# Patient Record
Sex: Male | Born: 1960 | Race: Black or African American | Hispanic: No | Marital: Married | Smoking: Never smoker
Health system: Southern US, Community
[De-identification: ages and names within clinical notes are randomized; demographics above are authoritative.]

## PROBLEM LIST (undated history)

## (undated) DIAGNOSIS — R569 Unspecified convulsions: Secondary | ICD-10-CM

## (undated) DIAGNOSIS — T7840XA Allergy, unspecified, initial encounter: Secondary | ICD-10-CM

## (undated) HISTORY — DX: Unspecified convulsions: R56.9

## (undated) HISTORY — DX: Allergy, unspecified, initial encounter: T78.40XA

---

## 2003-04-23 ENCOUNTER — Emergency Department (HOSPITAL_COMMUNITY): Admission: EM | Admit: 2003-04-23 | Discharge: 2003-04-23 | Payer: Self-pay | Admitting: Emergency Medicine

## 2007-07-11 ENCOUNTER — Emergency Department (HOSPITAL_COMMUNITY): Admission: EM | Admit: 2007-07-11 | Discharge: 2007-07-11 | Payer: Self-pay | Admitting: Emergency Medicine

## 2007-07-15 ENCOUNTER — Emergency Department (HOSPITAL_COMMUNITY): Admission: EM | Admit: 2007-07-15 | Discharge: 2007-07-15 | Payer: Self-pay | Admitting: Emergency Medicine

## 2007-08-18 ENCOUNTER — Emergency Department (HOSPITAL_COMMUNITY): Admission: EM | Admit: 2007-08-18 | Discharge: 2007-08-18 | Payer: Self-pay | Admitting: Emergency Medicine

## 2009-11-26 ENCOUNTER — Emergency Department (HOSPITAL_COMMUNITY)
Admission: EM | Admit: 2009-11-26 | Discharge: 2009-11-26 | Payer: Self-pay | Source: Home / Self Care | Admitting: Emergency Medicine

## 2012-06-08 ENCOUNTER — Encounter: Payer: Self-pay | Admitting: Internal Medicine

## 2012-06-08 ENCOUNTER — Ambulatory Visit (INDEPENDENT_AMBULATORY_CARE_PROVIDER_SITE_OTHER): Payer: Self-pay | Admitting: Internal Medicine

## 2012-06-08 ENCOUNTER — Other Ambulatory Visit (INDEPENDENT_AMBULATORY_CARE_PROVIDER_SITE_OTHER): Payer: Self-pay

## 2012-06-08 VITALS — BP 142/80 | HR 60 | Temp 98.8°F | Resp 16 | Ht 73.5 in | Wt 207.5 lb

## 2012-06-08 DIAGNOSIS — Z0000 Encounter for general adult medical examination without abnormal findings: Secondary | ICD-10-CM

## 2012-06-08 DIAGNOSIS — Z23 Encounter for immunization: Secondary | ICD-10-CM

## 2012-06-08 DIAGNOSIS — K4090 Unilateral inguinal hernia, without obstruction or gangrene, not specified as recurrent: Secondary | ICD-10-CM

## 2012-06-08 LAB — LIPID PANEL
HDL: 44.8 mg/dL (ref >39.00)
Total CHOL/HDL Ratio: 4
Triglycerides: 60 mg/dL (ref 0.0–149.0)
VLDL: 12 mg/dL (ref 0.0–40.0)

## 2012-06-08 LAB — URINALYSIS, ROUTINE W REFLEX MICROSCOPIC
Bilirubin Urine: NEGATIVE
Hgb urine dipstick: NEGATIVE
Ketones, ur: NEGATIVE
Nitrite: NEGATIVE
Total Protein, Urine: NEGATIVE
Urine Glucose: NEGATIVE
pH: 6 (ref 5.0–8.0)

## 2012-06-08 LAB — CBC WITH DIFFERENTIAL/PLATELET
Basophils Relative: 0.6 % (ref 0.0–3.0)
HCT: 40.1 % (ref 39.0–52.0)
Hemoglobin: 13.9 g/dL (ref 13.0–17.0)
Lymphocytes Relative: 31.3 % (ref 12.0–46.0)
Lymphs Abs: 1.6 10*3/uL (ref 0.7–4.0)
MCHC: 34.5 g/dL (ref 30.0–36.0)
Monocytes Relative: 15.6 % — ABNORMAL HIGH (ref 3.0–12.0)
Neutro Abs: 2.6 10*3/uL (ref 1.4–7.7)
RBC: 4.59 Mil/uL (ref 4.22–5.81)

## 2012-06-08 LAB — COMPREHENSIVE METABOLIC PANEL
AST: 35 U/L (ref 0–37)
BUN: 13 mg/dL (ref 6–23)
Calcium: 9.4 mg/dL (ref 8.4–10.5)
Chloride: 104 mEq/L (ref 96–112)
Creatinine, Ser: 1 mg/dL (ref 0.4–1.5)
GFR: 97.34 mL/min (ref >60.00)
Total Bilirubin: 1 mg/dL (ref 0.3–1.2)

## 2012-06-08 LAB — PSA: PSA: 6.42 ng/mL — ABNORMAL HIGH (ref 0.10–4.00)

## 2012-06-08 NOTE — Assessment & Plan Note (Addendum)
Exam done Vaccines were updated Labs ordered He was referred for a screening colonoscopy Pt ed material was given

## 2012-06-08 NOTE — Assessment & Plan Note (Signed)
General surgery referral 

## 2012-06-08 NOTE — Patient Instructions (Signed)
Health Maintenance, Males A healthy lifestyle and preventative care can promote health and wellness.  Maintain regular health, dental, and eye exams.  Eat a healthy diet. Foods like vegetables, fruits, whole grains, low-fat dairy products, and lean protein foods contain the nutrients you need without too many calories. Decrease your intake of foods high in solid fats, added sugars, and salt. Get information about a proper diet from your caregiver, if necessary.  Regular physical exercise is one of the most important things you can do for your health. Most adults should get at least 150 minutes of moderate-intensity exercise (any activity that increases your heart rate and causes you to sweat) each week. In addition, most adults need muscle-strengthening exercises on 2 or more days a week.   Maintain a healthy weight. The body mass index (BMI) is a screening tool to identify possible weight problems. It provides an estimate of body fat based on height and weight. Your caregiver can help determine your BMI, and can help you achieve or maintain a healthy weight. For adults 20 years and older:  A BMI below 18.5 is considered underweight.  A BMI of 18.5 to 24.9 is normal.  A BMI of 25 to 29.9 is considered overweight.  A BMI of 30 and above is considered obese.  Maintain normal blood lipids and cholesterol by exercising and minimizing your intake of saturated fat. Eat a balanced diet with plenty of fruits and vegetables. Blood tests for lipids and cholesterol should begin at age 20 and be repeated every 5 years. If your lipid or cholesterol levels are high, you are over 50, or you are a high risk for heart disease, you may need your cholesterol levels checked more frequently.Ongoing high lipid and cholesterol levels should be treated with medicines, if diet and exercise are not effective.  If you smoke, find out from your caregiver how to quit. If you do not use tobacco, do not start.  If you  choose to drink alcohol, do not exceed 2 drinks per day. One drink is considered to be 12 ounces (355 mL) of beer, 5 ounces (148 mL) of wine, or 1.5 ounces (44 mL) of liquor.  Avoid use of street drugs. Do not share needles with anyone. Ask for help if you need support or instructions about stopping the use of drugs.  High blood pressure causes heart disease and increases the risk of stroke. Blood pressure should be checked at least every 1 to 2 years. Ongoing high blood pressure should be treated with medicines if weight loss and exercise are not effective.  If you are 45 to 53 years old, ask your caregiver if you should take aspirin to prevent heart disease.  Diabetes screening involves taking a blood sample to check your fasting blood sugar level. This should be done once every 3 years, after age 45, if you are within normal weight and without risk factors for diabetes. Testing should be considered at a younger age or be carried out more frequently if you are overweight and have at least 1 risk factor for diabetes.  Colorectal cancer can be detected and often prevented. Most routine colorectal cancer screening begins at the age of 50 and continues through age 75. However, your caregiver may recommend screening at an earlier age if you have risk factors for colon cancer. On a yearly basis, your caregiver may provide home test kits to check for hidden blood in the stool. Use of a small camera at the end of a tube,   to directly examine the colon (sigmoidoscopy or colonoscopy), can detect the earliest forms of colorectal cancer. Talk to your caregiver about this at age 50, when routine screening begins. Direct examination of the colon should be repeated every 5 to 10 years through age 75, unless early forms of pre-cancerous polyps or small growths are found.  Hepatitis C blood testing is recommended for all people born from 1945 through 1965 and any individual with known risks for hepatitis C.  Healthy  men should no longer receive prostate-specific antigen (PSA) blood tests as part of routine cancer screening. Consult with your caregiver about prostate cancer screening.  Testicular cancer screening is not recommended for adolescents or adult males who have no symptoms. Screening includes self-exam, caregiver exam, and other screening tests. Consult with your caregiver about any symptoms you have or any concerns you have about testicular cancer.  Practice safe sex. Use condoms and avoid high-risk sexual practices to reduce the spread of sexually transmitted infections (STIs).  Use sunscreen with a sun protection factor (SPF) of 30 or greater. Apply sunscreen liberally and repeatedly throughout the day. You should seek shade when your shadow is shorter than you. Protect yourself by wearing long sleeves, pants, a wide-brimmed hat, and sunglasses year round, whenever you are outdoors.  Notify your caregiver of new moles or changes in moles, especially if there is a change in shape or color. Also notify your caregiver if a mole is larger than the size of a pencil eraser.  A one-time screening for abdominal aortic aneurysm (AAA) and surgical repair of large AAAs by sound wave imaging (ultrasonography) is recommended for ages 65 to 75 years who are current or former smokers.  Stay current with your immunizations. Document Released: 07/18/2007 Document Revised: 04/13/2011 Document Reviewed: 06/16/2010 [employer] Patient Information 2013 [employer].  

## 2012-06-08 NOTE — Progress Notes (Signed)
  Subjective:    Patient ID: Greg Kim, male    DOB: 02/10/1960, 52 y.o.   MRN: [id number]  HPI  New to me for a physical - he complains of a bulge in his left groin for one year, there is no associated pain.  Review of Systems  Constitutional: Negative.   HENT: Negative.   Eyes: Negative.   Respiratory: Negative.  Negative for cough, chest tightness, shortness of breath, wheezing and stridor.   Cardiovascular: Negative.  Negative for chest pain, palpitations and leg swelling.  Gastrointestinal: Negative.  Negative for nausea, vomiting, abdominal pain, diarrhea, constipation, abdominal distention, anal bleeding and rectal pain.  Endocrine: Negative.   Genitourinary: Negative.  Negative for dysuria, urgency, frequency, hematuria, flank pain, decreased urine volume, discharge, penile swelling, scrotal swelling, enuresis, difficulty urinating, genital sores, penile pain and testicular pain.  Musculoskeletal: Negative.   Skin: Negative.   Allergic/Immunologic: Negative.   Neurological: Negative.   Hematological: Negative.   Psychiatric/Behavioral: Negative.        Objective:   Physical Exam  Vitals reviewed. Constitutional: He is oriented to person, place, and time. He appears well-developed and well-nourished. No distress.  HENT:  Head: Normocephalic and atraumatic.  Mouth/Throat: Oropharynx is clear and moist. No oropharyngeal exudate.  Eyes: Conjunctivae are normal. Right eye exhibits no discharge. Left eye exhibits no discharge. No scleral icterus.  Neck: Normal range of motion. Neck supple. No JVD present. No tracheal deviation present. No thyromegaly present.  Cardiovascular: Normal rate, regular rhythm, normal heart sounds and intact distal pulses.  Exam reveals no gallop and no friction rub.   No murmur heard. Pulmonary/Chest: Effort normal and breath sounds normal. No stridor. No respiratory distress. He has no wheezes. He has no rales. He exhibits no tenderness.   Abdominal: Soft. Bowel sounds are normal. He exhibits no distension and no mass. There is no tenderness. There is no rebound and no guarding. A hernia is present. Hernia confirmed positive in the left inguinal area. Hernia confirmed negative in the right inguinal area.  Genitourinary: Rectum normal and penis normal. Rectal exam shows no external hemorrhoid, no internal hemorrhoid, no fissure, no mass, no tenderness and anal tone normal. Guaiac negative stool. Prostate is enlarged (1+ smooth symm BPH). Prostate is not tender. Right testis shows no mass, no swelling and no tenderness. Right testis is descended. Left testis shows swelling. Left testis shows no mass and no tenderness. Left testis is descended. Uncircumcised. No phimosis, paraphimosis, hypospadias, penile erythema or penile tenderness. No discharge found.  Right hemiscrotum feels empty, the left side is filled with hernia, there is no mass,swelling,tenderness  Musculoskeletal: Normal range of motion. He exhibits no edema and no tenderness.  Lymphadenopathy:    He has no cervical adenopathy.       Right: No inguinal adenopathy present.       Left: No inguinal adenopathy present.  Neurological: He is oriented to person, place, and time.  Skin: Skin is warm and dry. No rash noted. He is not diaphoretic. No erythema. No pallor.  Psychiatric: He has a normal mood and affect. His behavior is normal. Judgment and thought content normal.      No results found for this basename: WBC, HGB, HCT, PLT, GLUCOSE, CHOL, TRIG, HDL, LDLDIRECT, LDLCALC, ALT, AST, NA, K, CL, CREATININE, BUN, CO2, TSH, PSA, INR, GLUF, HGBA1C, MICROALBUR      Assessment & Plan:

## 2012-06-10 ENCOUNTER — Ambulatory Visit (INDEPENDENT_AMBULATORY_CARE_PROVIDER_SITE_OTHER): Payer: Self-pay | Admitting: General Surgery

## 2012-07-01 ENCOUNTER — Ambulatory Visit (INDEPENDENT_AMBULATORY_CARE_PROVIDER_SITE_OTHER): Payer: Self-pay | Admitting: General Surgery

## 2017-09-02 ENCOUNTER — Emergency Department (HOSPITAL_COMMUNITY): Payer: No Typology Code available for payment source

## 2017-09-02 ENCOUNTER — Emergency Department (HOSPITAL_COMMUNITY)
Admission: EM | Admit: 2017-09-02 | Discharge: 2017-09-02 | Disposition: A | Discharge status: Home / Self Care | Payer: No Typology Code available for payment source | Attending: Emergency Medicine | Admitting: Emergency Medicine

## 2017-09-02 ENCOUNTER — Encounter (HOSPITAL_COMMUNITY): Payer: Self-pay | Admitting: Emergency Medicine

## 2017-09-02 ENCOUNTER — Other Ambulatory Visit: Payer: Self-pay

## 2017-09-02 DIAGNOSIS — M791 Myalgia, unspecified site: Secondary | ICD-10-CM | POA: Diagnosis not present

## 2017-09-02 DIAGNOSIS — K4090 Unilateral inguinal hernia, without obstruction or gangrene, not specified as recurrent: Secondary | ICD-10-CM

## 2017-09-02 DIAGNOSIS — M7918 Myalgia, other site: Secondary | ICD-10-CM

## 2017-09-02 MED ORDER — CYCLOBENZAPRINE HCL 10 MG PO TABS: 10.0000 mg | ORAL_TABLET | Freq: Two times a day (BID) | ORAL | 0 refills | Status: DC | PRN
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
## 2017-09-02 NOTE — Discharge Instructions (Addendum)
Please read attached information. If you experience any new or worsening signs or symptoms please return to the emergency room for evaluation. Please follow-up with your primary care provider or specialist as discussed. Please use medication prescribed only as directed and discontinue taking if you have any concerning signs or symptoms.   °
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
## 2020-06-12 ENCOUNTER — Encounter: Payer: Self-pay | Admitting: *Deleted

## 2020-06-14 ENCOUNTER — Encounter: Payer: Self-pay | Admitting: Neurology

## 2020-06-14 ENCOUNTER — Ambulatory Visit: Payer: Self-pay | Admitting: Neurology

## 2020-06-14 ENCOUNTER — Other Ambulatory Visit: Payer: Self-pay

## 2020-06-14 VITALS — BP 135/70 | HR 46 | Ht 73.0 in | Wt 178.5 lb

## 2020-06-14 DIAGNOSIS — G40909 Epilepsy, unspecified, not intractable, without status epilepticus: Secondary | ICD-10-CM

## 2020-06-14 MED ORDER — LEVETIRACETAM ER 500 MG PO TB24: 1000.0000 mg | ORAL_TABLET | Freq: Every day | ORAL | 4 refills | Status: AC

## 2020-06-14 NOTE — Progress Notes (Signed)
Chief Complaint  Patient presents with  . New Patient (Initial Visit)    RM 12, alone. Paper referral from Silvio Clayman at [facility] for seizure disorder. First seizure 04/08/2020. He was working 3-4 jobs in 1 week that week and then drove his wife out of state for her mother b-day. Feels he was under a lot of stress and this was cause of seizures. No episodes since this. Feeling ok. No new sx. Does smoke Marijuana weekly. Only taking Keppra weekly, feels fine and did not feel he needed to take as prescribed.  . PCP    Silvio Clayman      ASSESSMENT AND PLAN  Greg Kim is a 60 y.o. male   Nocturnal seizure on April 09, 2020 Reported recurrent spells of short episode of strange smells, last episode was 3 weeks ago end of April 2022  Complete seizure evaluation with MRI of the brain with without contrast  EEG  Laboratory evaluations  Receives recurrent spells of strange smells, suggestive of partial seizures, emphasized importance of compliance with his antiepileptic medications, after discussed patient, proceed with Keppra xr 500 mg 2 tablets every night  No driving until seizure-free for 6 months   DIAGNOSTIC DATA (LABS, IMAGING, TESTING) - I reviewed patient records, labs, notes, testing and imaging myself where available.   HISTORICAL  Greg Kim is a 60 year old male seen in request by his primary care physician Dr. Salli Real, for evaluation of seizure, initial evaluation was a long on Jun 14, 2020  I reviewed and summarized the referring note.  He was previously healthy, very active, work as a Event organiser in Holiday representative work, including roofing,  On March 8 early morning, he was visiting with his mother-in-law in [place], after few hours into sleep, he was noted by his wife had a seizure, ambulance was called, he was evaluated at local hospital, few hours later he was discharged with Keppra 500 mg twice daily  Patient only scattered memory of the  event, he cannot recall his ER visit, only when he was ready to be discharged, he remembered the conversation, was told he had a seizure, he did take Keppra 500 mg twice a day for 1 month, complains of fatigue, sleepiness, after he ran out of the medication, he stopped taking it  Following his nocturnal seizure, he did have recurrent spells of sudden episode of smelling something strange, lasting for couple minutes, no loss of consciousness, most recent one was 3 weeks ago in April 2022    PHYSICAL EXAM:   Vitals:   06/14/20 0910  BP: 135/70  Pulse: (!) 46  SpO2: 98%  Weight: 178 lb 8 oz (81 kg)  Height: 6\' 1"  (1.854 m)   Not recorded     Body mass index is 23.55 kg/m.  PHYSICAL EXAMNIATION:  Gen: NAD, conversant, well nourised, well groomed                     Cardiovascular: Regular rate rhythm, no peripheral edema, warm, nontender. Eyes: Conjunctivae clear without exudates or hemorrhage Neck: Supple, no carotid bruits. Pulmonary: Clear to auscultation bilaterally   NEUROLOGICAL EXAM:  MENTAL STATUS: Speech:    Speech is normal; fluent and spontaneous with normal comprehension.  Cognition:     Orientation to time, place and person     Normal recent and remote memory     Normal Attention span and concentration     Normal Language, naming, repeating,spontaneous speech  Fund of knowledge   CRANIAL NERVES: CN II: Visual fields are full to confrontation. Pupils are round equal and briskly reactive to light. CN III, IV, VI: extraocular movement are normal. No ptosis. CN V: Facial sensation is intact to light touch CN VII: Face is symmetric with normal eye closure  CN VIII: Hearing is normal to causal conversation. CN IX, X: Phonation is normal. CN XI: Head turning and shoulder shrug are intact  MOTOR: There is no pronator drift of out-stretched arms. Muscle bulk and tone are normal. Muscle strength is normal.  REFLEXES: Reflexes are 2+ and symmetric at the  biceps, triceps, knees, and ankles. Plantar responses are flexor.  SENSORY: Intact to light touch, pinprick and vibratory sensation are intact in fingers and toes.  COORDINATION: There is no trunk or limb dysmetria noted.  GAIT/STANCE: Posture is normal. Gait is steady with normal steps, base, arm swing, and turning. Heel and toe walking are normal. Tandem gait is normal.  Romberg is absent.  REVIEW OF SYSTEMS:  Full 14 system review of systems performed and notable only for as above All other review of systems were negative.   ALLERGIES: No Known Allergies  HOME MEDICATIONS: Current Outpatient Medications  Medication Sig Dispense Refill  . levETIRAcetam (KEPPRA) 500 MG tablet Take 500 mg by mouth 2 (two) times daily. (Patient not taking: No sig reported)     No current facility-administered medications for this visit.    PAST MEDICAL HISTORY: Past Medical History:  Diagnosis Date  . Allergy   . Seizure (HCC)     PAST SURGICAL HISTORY: Past Surgical History:  Procedure Laterality Date  . No past surgery      FAMILY HISTORY: Family History  Problem Relation Age of Onset  . Arthritis Mother   . Heart disease Father   . Hypertension Father   . Arthritis Other   . Hypertension Other   . Cancer Neg Hx   . COPD Neg Hx   . Diabetes Neg Hx   . Early death Neg Hx   . Hearing loss Neg Hx   . Hyperlipidemia Neg Hx   . Kidney disease Neg Hx   . Stroke Neg Hx     SOCIAL HISTORY: Social History   Socioeconomic History  . Marital status: Married    Spouse name: Branndon Tuite  . Number of children: 1  . Years of education: 59  . Highest education level: Not on file  Occupational History  . Occupation: Self employed  Tobacco Use  . Smoking status: Never Smoker  . Smokeless tobacco: Never Used  Substance and Sexual Activity  . Alcohol use: No  . Drug use: Yes    Types: Marijuana    Comment: 1-2 times per week  . Sexual activity: Yes  Other Topics Concern   . Not on file  Social History Narrative   Lives   Caffeine use:   Social Determinants of Health   Financial Resource Strain: Not on file  Food Insecurity: Not on file  Transportation Needs: Not on file  Physical Activity: Not on file  Stress: Not on file  Social Connections: Not on file  Intimate Partner Violence: Not on file      Levert Feinstein, M.D. Ph.D.  [facility] [address] [place], [place] [address] Ph: [phone] Fax: [phone]  CC:  Salli Real, MD [address] [place],  [place] [address]  Salli Real, MD

## 2020-06-14 NOTE — Patient Instructions (Signed)
No driving until seizure-free for 6 months 

## 2020-06-15 LAB — COMPREHENSIVE METABOLIC PANEL
Alkaline Phosphatase: 70 IU/L (ref 44–121)
Creatinine, Ser: 0.92 mg/dL (ref 0.76–1.27)

## 2020-06-15 LAB — CBC WITH DIFFERENTIAL
Basos: 1 %
EOS (ABSOLUTE): 0.1 10*3/uL (ref 0.0–0.4)
Immature Granulocytes: 0 %
MCV: 89 fL (ref 79–97)
RBC: 4.31 x10E6/uL (ref 4.14–5.80)

## 2020-06-15 LAB — LIPID PANEL: HDL: 67 mg/dL (ref >39)

## 2020-06-18 ENCOUNTER — Telehealth: Payer: Self-pay | Admitting: Neurology

## 2020-06-18 LAB — CBC WITH DIFFERENTIAL
Basophils Absolute: 0 10*3/uL (ref 0.0–0.2)
Eos: 2 %
Hematocrit: 38.3 % (ref 37.5–51.0)
Hemoglobin: 12.9 g/dL — ABNORMAL LOW (ref 13.0–17.7)
Immature Grans (Abs): 0 10*3/uL (ref 0.0–0.1)
Lymphocytes Absolute: 1.4 10*3/uL (ref 0.7–3.1)
Lymphs: 31 %
MCH: 29.9 pg (ref 26.6–33.0)
MCHC: 33.7 g/dL (ref 31.5–35.7)
Monocytes Absolute: 0.8 10*3/uL (ref 0.1–0.9)
Monocytes: 18 %
Neutrophils Absolute: 2.2 10*3/uL (ref 1.4–7.0)
Neutrophils: 48 %
RDW: 13 % (ref 11.6–15.4)
WBC: 4.4 10*3/uL (ref 3.4–10.8)

## 2020-06-18 LAB — COMPREHENSIVE METABOLIC PANEL
ALT: 22 IU/L (ref 0–44)
AST: 26 IU/L (ref 0–40)
Albumin/Globulin Ratio: 2.1 (ref 1.2–2.2)
Albumin: 4.6 g/dL (ref 3.8–4.9)
BUN/Creatinine Ratio: 17 (ref 10–24)
BUN: 16 mg/dL (ref 8–27)
Bilirubin Total: 0.3 mg/dL (ref 0.0–1.2)
CO2: 22 mmol/L (ref 20–29)
Calcium: 9.8 mg/dL (ref 8.6–10.2)
Chloride: 100 mmol/L (ref 96–106)
Globulin, Total: 2.2 g/dL (ref 1.5–4.5)
Glucose: 102 mg/dL — ABNORMAL HIGH (ref 65–99)
Potassium: 4.4 mmol/L (ref 3.5–5.2)
Sodium: 139 mmol/L (ref 134–144)
Total Protein: 6.8 g/dL (ref 6.0–8.5)
eGFR: 95 mL/min/{1.73_m2} (ref >59)

## 2020-06-18 LAB — LIPID PANEL
Chol/HDL Ratio: 3 ratio (ref 0.0–5.0)
Cholesterol, Total: 198 mg/dL (ref 100–199)
LDL Chol Calc (NIH): 121 mg/dL — ABNORMAL HIGH (ref 0–99)
Triglycerides: 53 mg/dL (ref 0–149)
VLDL Cholesterol Cal: 10 mg/dL (ref 5–40)

## 2020-06-18 LAB — RPR: RPR Ser Ql: NONREACTIVE

## 2020-06-18 LAB — HIV ANTIBODY (ROUTINE TESTING W REFLEX): HIV Screen 4th Generation wRfx: NONREACTIVE

## 2020-06-18 LAB — TSH: TSH: 1.52 u[IU]/mL (ref 0.450–4.500)

## 2020-06-18 LAB — HGB A1C W/O EAG: Hgb A1c MFr Bld: 5.4 % (ref 4.8–5.6)

## 2020-06-18 NOTE — Telephone Encounter (Signed)
I spoke to his wife on [place]. She verbalized understanding and stated his PCP is watching it closely.

## 2020-06-18 NOTE — Telephone Encounter (Signed)
Please call patient, laboratory evaluation showed elevated LDL, he should start by diet control, exercise,  Rest of the laboratory evaluation showed no significant abnormality.  I have forwarded the lab result to his primary care Dr Salli Real, MD

## 2020-06-18 NOTE — Telephone Encounter (Signed)
LVM at [phone] for pt to call office about results

## 2020-07-03 ENCOUNTER — Ambulatory Visit: Payer: Self-pay

## 2020-07-03 ENCOUNTER — Ambulatory Visit
Admission: RE | Admit: 2020-07-03 | Discharge: 2020-07-03 | Disposition: A | Discharge status: Home / Self Care | Payer: Self-pay | Source: Ambulatory Visit | Attending: Neurology | Admitting: Neurology

## 2020-07-03 ENCOUNTER — Encounter: Payer: Self-pay | Admitting: Neurology

## 2020-07-03 DIAGNOSIS — G40909 Epilepsy, unspecified, not intractable, without status epilepticus: Secondary | ICD-10-CM

## 2020-07-03 MED ORDER — GADOBENATE DIMEGLUMINE 529 MG/ML IV SOLN
16.0000 mL | Freq: Once | INTRAVENOUS | Status: AC | PRN
  Administered 2020-07-03: 16 mL via INTRAVENOUS

## 2020-07-08 ENCOUNTER — Telehealth: Payer: Self-pay | Admitting: *Deleted

## 2020-07-08 NOTE — Telephone Encounter (Signed)
I spoke to his wife on [place]. She verbalized understanding of the MRI results and will relay them to the patient. Provided our number to call back with any questions.

## 2020-07-08 NOTE — Telephone Encounter (Signed)
-----   Message from Levert Feinstein, MD sent at 07/04/2020  7:28 PM EDT ----- Please call pt for normal MRI brain.

## 2020-12-25 ENCOUNTER — Ambulatory Visit: Payer: Self-pay | Admitting: Neurology

## 2023-07-28 ENCOUNTER — Other Ambulatory Visit: Payer: Self-pay | Admitting: Urology

## 2023-07-28 DIAGNOSIS — K4090 Unilateral inguinal hernia, without obstruction or gangrene, not specified as recurrent: Secondary | ICD-10-CM
